# Patient Record
Sex: Female | Born: 1995 | Race: White | Hispanic: No | Marital: Single | Smoking: Never smoker
Health system: Southern US, Community
[De-identification: ages and names within clinical notes are randomized; demographics above are authoritative.]

## PROBLEM LIST (undated history)

## (undated) DIAGNOSIS — E031 Congenital hypothyroidism without goiter: Secondary | ICD-10-CM

## (undated) DIAGNOSIS — L509 Urticaria, unspecified: Secondary | ICD-10-CM

## (undated) HISTORY — PX: TONSILLECTOMY: SUR1361

## (undated) HISTORY — DX: Urticaria, unspecified: L50.9

## (undated) HISTORY — PX: ADENOIDECTOMY: SUR15

## (undated) HISTORY — DX: Congenital hypothyroidism without goiter: E03.1

---
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
## 2019-11-27 ENCOUNTER — Encounter (HOSPITAL_COMMUNITY): Payer: Self-pay | Admitting: Emergency Medicine

## 2021-06-25 DIAGNOSIS — L700 Acne vulgaris: Secondary | ICD-10-CM | POA: Diagnosis not present

## 2021-10-25 DIAGNOSIS — L700 Acne vulgaris: Secondary | ICD-10-CM | POA: Diagnosis not present

## 2022-04-18 DIAGNOSIS — Z309 Encounter for contraceptive management, unspecified: Secondary | ICD-10-CM | POA: Diagnosis not present

## 2022-04-18 DIAGNOSIS — Z124 Encounter for screening for malignant neoplasm of cervix: Secondary | ICD-10-CM | POA: Diagnosis not present

## 2022-04-18 DIAGNOSIS — Z6822 Body mass index (BMI) 22.0-22.9, adult: Secondary | ICD-10-CM | POA: Diagnosis not present

## 2022-04-18 DIAGNOSIS — Z01419 Encounter for gynecological examination (general) (routine) without abnormal findings: Secondary | ICD-10-CM | POA: Diagnosis not present

## 2022-09-20 DIAGNOSIS — L700 Acne vulgaris: Secondary | ICD-10-CM | POA: Diagnosis not present

## 2022-10-31 DIAGNOSIS — M533 Sacrococcygeal disorders, not elsewhere classified: Secondary | ICD-10-CM | POA: Diagnosis not present

## 2022-10-31 DIAGNOSIS — G8929 Other chronic pain: Secondary | ICD-10-CM | POA: Diagnosis not present

## 2022-10-31 DIAGNOSIS — E559 Vitamin D deficiency, unspecified: Secondary | ICD-10-CM | POA: Diagnosis not present

## 2022-11-24 DIAGNOSIS — S322XXA Fracture of coccyx, initial encounter for closed fracture: Secondary | ICD-10-CM | POA: Diagnosis not present

## 2022-11-24 DIAGNOSIS — S3210XA Unspecified fracture of sacrum, initial encounter for closed fracture: Secondary | ICD-10-CM | POA: Diagnosis not present

## 2022-11-30 ENCOUNTER — Other Ambulatory Visit: Payer: Self-pay | Admitting: Orthopedic Surgery

## 2022-11-30 DIAGNOSIS — M5459 Other low back pain: Secondary | ICD-10-CM

## 2022-12-01 DIAGNOSIS — E559 Vitamin D deficiency, unspecified: Secondary | ICD-10-CM | POA: Diagnosis not present

## 2022-12-06 ENCOUNTER — Encounter: Payer: Self-pay | Admitting: Orthopedic Surgery

## 2022-12-14 ENCOUNTER — Ambulatory Visit
Admission: RE | Admit: 2022-12-14 | Discharge: 2022-12-14 | Disposition: A | Discharge status: Home / Self Care | Payer: 59 | Source: Ambulatory Visit | Attending: Orthopedic Surgery | Admitting: Orthopedic Surgery

## 2022-12-14 DIAGNOSIS — M5459 Other low back pain: Secondary | ICD-10-CM

## 2023-04-20 DIAGNOSIS — N898 Other specified noninflammatory disorders of vagina: Secondary | ICD-10-CM | POA: Diagnosis not present

## 2023-04-20 DIAGNOSIS — Z124 Encounter for screening for malignant neoplasm of cervix: Secondary | ICD-10-CM | POA: Diagnosis not present

## 2023-04-20 DIAGNOSIS — Z309 Encounter for contraceptive management, unspecified: Secondary | ICD-10-CM | POA: Diagnosis not present

## 2023-04-20 DIAGNOSIS — Z01419 Encounter for gynecological examination (general) (routine) without abnormal findings: Secondary | ICD-10-CM | POA: Diagnosis not present

## 2023-04-20 DIAGNOSIS — Z113 Encounter for screening for infections with a predominantly sexual mode of transmission: Secondary | ICD-10-CM | POA: Diagnosis not present

## 2023-04-20 DIAGNOSIS — Z1339 Encounter for screening examination for other mental health and behavioral disorders: Secondary | ICD-10-CM | POA: Diagnosis not present

## 2023-05-04 DIAGNOSIS — N390 Urinary tract infection, site not specified: Secondary | ICD-10-CM | POA: Diagnosis not present

## 2023-05-04 DIAGNOSIS — R350 Frequency of micturition: Secondary | ICD-10-CM | POA: Diagnosis not present

## 2023-05-04 DIAGNOSIS — R300 Dysuria: Secondary | ICD-10-CM | POA: Diagnosis not present

## 2023-12-27 DIAGNOSIS — E031 Congenital hypothyroidism without goiter: Secondary | ICD-10-CM | POA: Diagnosis not present

## 2023-12-27 DIAGNOSIS — Z3169 Encounter for other general counseling and advice on procreation: Secondary | ICD-10-CM | POA: Diagnosis not present

## 2024-01-08 DIAGNOSIS — E162 Hypoglycemia, unspecified: Secondary | ICD-10-CM | POA: Diagnosis not present

## 2024-01-08 DIAGNOSIS — E559 Vitamin D deficiency, unspecified: Secondary | ICD-10-CM | POA: Diagnosis not present

## 2024-01-08 DIAGNOSIS — E031 Congenital hypothyroidism without goiter: Secondary | ICD-10-CM | POA: Diagnosis not present

## 2024-01-29 DIAGNOSIS — R509 Fever, unspecified: Secondary | ICD-10-CM | POA: Diagnosis not present

## 2024-01-29 DIAGNOSIS — J028 Acute pharyngitis due to other specified organisms: Secondary | ICD-10-CM | POA: Diagnosis not present

## 2024-01-29 DIAGNOSIS — R051 Acute cough: Secondary | ICD-10-CM | POA: Diagnosis not present

## 2024-01-29 DIAGNOSIS — J069 Acute upper respiratory infection, unspecified: Secondary | ICD-10-CM | POA: Diagnosis not present

## 2024-01-29 DIAGNOSIS — R0981 Nasal congestion: Secondary | ICD-10-CM | POA: Diagnosis not present

## 2024-03-07 DIAGNOSIS — L603 Nail dystrophy: Secondary | ICD-10-CM | POA: Diagnosis not present

## 2024-03-07 DIAGNOSIS — L700 Acne vulgaris: Secondary | ICD-10-CM | POA: Diagnosis not present
